# Patient Record
Sex: Male | Born: 1975 | Race: Black or African American | Hispanic: No | Marital: Single | Smoking: Former smoker
Health system: Southern US, Community
[De-identification: ages and names within clinical notes are randomized; demographics above are authoritative.]

## PROBLEM LIST (undated history)

## (undated) DIAGNOSIS — S82891A Other fracture of right lower leg, initial encounter for closed fracture: Secondary | ICD-10-CM

## (undated) DIAGNOSIS — T8149XA Infection following a procedure, other surgical site, initial encounter: Secondary | ICD-10-CM

## (undated) DIAGNOSIS — K219 Gastro-esophageal reflux disease without esophagitis: Secondary | ICD-10-CM

---

## 2004-11-17 HISTORY — PX: ARTHROSCOPIC REPAIR ACL: SUR80

## 2016-08-24 ENCOUNTER — Emergency Department (HOSPITAL_COMMUNITY)
Admission: EM | Admit: 2016-08-24 | Discharge: 2016-08-24 | Disposition: A | Discharge status: Home / Self Care | Payer: Self-pay | Attending: Emergency Medicine | Admitting: Emergency Medicine

## 2016-08-24 ENCOUNTER — Emergency Department (HOSPITAL_COMMUNITY): Payer: Self-pay

## 2016-08-24 ENCOUNTER — Encounter (HOSPITAL_COMMUNITY): Payer: Self-pay | Admitting: Emergency Medicine

## 2016-08-24 DIAGNOSIS — S82451B Displaced comminuted fracture of shaft of right fibula, initial encounter for open fracture type I or II: Secondary | ICD-10-CM | POA: Insufficient documentation

## 2016-08-24 DIAGNOSIS — T148XXA Other injury of unspecified body region, initial encounter: Secondary | ICD-10-CM

## 2016-08-24 DIAGNOSIS — Z23 Encounter for immunization: Secondary | ICD-10-CM | POA: Insufficient documentation

## 2016-08-24 DIAGNOSIS — W3400XA Accidental discharge from unspecified firearms or gun, initial encounter: Secondary | ICD-10-CM | POA: Insufficient documentation

## 2016-08-24 DIAGNOSIS — Y9289 Other specified places as the place of occurrence of the external cause: Secondary | ICD-10-CM | POA: Insufficient documentation

## 2016-08-24 DIAGNOSIS — Y999 Unspecified external cause status: Secondary | ICD-10-CM | POA: Insufficient documentation

## 2016-08-24 DIAGNOSIS — Y9302 Activity, running: Secondary | ICD-10-CM | POA: Insufficient documentation

## 2016-08-24 LAB — PROTIME-INR
INR: 1.01
Prothrombin Time: 13.3 seconds (ref 11.4–15.2)

## 2016-08-24 LAB — COMPREHENSIVE METABOLIC PANEL
ALBUMIN: 4 g/dL (ref 3.5–5.0)
ALK PHOS: 97 U/L (ref 38–126)
ALT: 25 U/L (ref 17–63)
AST: 34 U/L (ref 15–41)
Anion gap: 14 (ref 5–15)
BILIRUBIN TOTAL: 0.4 mg/dL (ref 0.3–1.2)
BUN: 14 mg/dL (ref 6–20)
CALCIUM: 8.7 mg/dL — AB (ref 8.9–10.3)
CO2: 19 mmol/L — ABNORMAL LOW (ref 22–32)
Chloride: 106 mmol/L (ref 101–111)
Creatinine, Ser: 1.69 mg/dL — ABNORMAL HIGH (ref 0.61–1.24)
GFR calc Af Amer: 57 mL/min — ABNORMAL LOW (ref >60)
GFR calc non Af Amer: 49 mL/min — ABNORMAL LOW (ref >60)
GLUCOSE: 117 mg/dL — AB (ref 65–99)
Potassium: 3.1 mmol/L — ABNORMAL LOW (ref 3.5–5.1)
Sodium: 139 mmol/L (ref 135–145)
TOTAL PROTEIN: 6.8 g/dL (ref 6.5–8.1)

## 2016-08-24 LAB — ETHANOL: ALCOHOL ETHYL (B): 55 mg/dL — AB (ref <5)

## 2016-08-24 LAB — I-STAT CHEM 8, ED
BUN: 16 mg/dL (ref 6–20)
CALCIUM ION: 1.09 mmol/L — AB (ref 1.15–1.40)
CHLORIDE: 104 mmol/L (ref 101–111)
Creatinine, Ser: 1.8 mg/dL — ABNORMAL HIGH (ref 0.61–1.24)
Glucose, Bld: 116 mg/dL — ABNORMAL HIGH (ref 65–99)
HEMATOCRIT: 43 % (ref 39.0–52.0)
Hemoglobin: 14.6 g/dL (ref 13.0–17.0)
POTASSIUM: 3.2 mmol/L — AB (ref 3.5–5.1)
SODIUM: 141 mmol/L (ref 135–145)
TCO2: 20 mmol/L (ref 0–100)

## 2016-08-24 LAB — CBC
HCT: 42 % (ref 39.0–52.0)
Hemoglobin: 13.6 g/dL (ref 13.0–17.0)
MCH: 27.5 pg (ref 26.0–34.0)
MCHC: 32.4 g/dL (ref 30.0–36.0)
MCV: 85 fL (ref 78.0–100.0)
Platelets: 203 10*3/uL (ref 150–400)
RBC: 4.94 MIL/uL (ref 4.22–5.81)
RDW: 12.3 % (ref 11.5–15.5)
WBC: 8.6 10*3/uL (ref 4.0–10.5)

## 2016-08-24 LAB — SAMPLE TO BLOOD BANK

## 2016-08-24 LAB — I-STAT CG4 LACTIC ACID, ED: LACTIC ACID, VENOUS: 5.1 mmol/L — AB (ref 0.5–1.9)

## 2016-08-24 LAB — CDS SEROLOGY

## 2016-08-24 MED ORDER — IOPAMIDOL (ISOVUE-370) INJECTION 76%
INTRAVENOUS | Status: AC
  Filled 2016-08-24: qty 100

## 2016-08-24 MED ORDER — FENTANYL CITRATE (PF) 100 MCG/2ML IJ SOLN
50.0000 ug | Freq: Once | INTRAMUSCULAR | Status: AC
  Administered 2016-08-24: 50 ug via INTRAVENOUS

## 2016-08-24 MED ORDER — OXYCODONE-ACETAMINOPHEN 5-325 MG PO TABS: 1.0000 | ORAL_TABLET | Freq: Four times a day (QID) | ORAL | 0 refills | Status: DC | PRN

## 2016-08-24 MED ORDER — CEFAZOLIN IN D5W 1 GM/50ML IV SOLN: 1.0000 g | Freq: Once | INTRAVENOUS | Status: DC

## 2016-08-24 MED ORDER — TETANUS-DIPHTH-ACELL PERTUSSIS 5-2.5-18.5 LF-MCG/0.5 IM SUSP
0.5000 mL | Freq: Once | INTRAMUSCULAR | Status: AC
  Administered 2016-08-24: 0.5 mL via INTRAMUSCULAR

## 2016-08-24 MED ORDER — DEXTROSE 5 % IV SOLN
INTRAVENOUS | Status: AC | PRN
  Administered 2016-08-24: 1000 mg via INTRAVENOUS

## 2016-08-24 MED ORDER — KETOROLAC TROMETHAMINE 30 MG/ML IJ SOLN
30.0000 mg | Freq: Once | INTRAMUSCULAR | Status: AC
  Administered 2016-08-24: 30 mg via INTRAVENOUS
  Filled 2016-08-24: qty 1

## 2016-08-24 MED ORDER — SODIUM CHLORIDE 0.9 % IV BOLUS (SEPSIS)
1000.0000 mL | Freq: Once | INTRAVENOUS | Status: AC
  Administered 2016-08-24: 1000 mL via INTRAVENOUS

## 2016-08-24 MED ORDER — CEPHALEXIN 500 MG PO CAPS: 500.0000 mg | ORAL_CAPSULE | Freq: Four times a day (QID) | ORAL | 0 refills | Status: DC

## 2016-08-24 MED ORDER — SODIUM CHLORIDE 0.9 % IV SOLN: 1000.0000 mL | Freq: Once | INTRAVENOUS | Status: DC

## 2016-08-24 MED ORDER — OXYCODONE-ACETAMINOPHEN 5-325 MG PO TABS
1.0000 | ORAL_TABLET | ORAL | Status: DC | PRN
  Administered 2016-08-24: 1 via ORAL
  Filled 2016-08-24: qty 1

## 2016-08-24 NOTE — Progress Notes (Signed)
Orthopedic Tech Progress Note Patient Details:  Daniel David 1976-07-11 [id number]  Ortho Devices Type of Ortho Device: Crutches Ortho Device/Splint Location: rle Ortho Device/Splint Interventions: Ordered, Application   Trinna Post 08/24/2016, 4:49 AM
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
## 2016-08-24 NOTE — Progress Notes (Addendum)
Orthopedic Tech Progress Note Patient Details:  Daniel David 1976/01/16 [id number]  Ortho Devices Type of Ortho Device: Post (short leg) splint Ortho Device/Splint Location: rle Ortho Device/Splint Interventions: Ordered, Application Applied splint as per drs verbal order  [name], Elba Dendinger J 08/24/2016, 3:22 AM

## 2016-08-28 ENCOUNTER — Encounter (HOSPITAL_BASED_OUTPATIENT_CLINIC_OR_DEPARTMENT_OTHER): Payer: Self-pay | Admitting: *Deleted
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
## 2016-09-02 ENCOUNTER — Ambulatory Visit (HOSPITAL_BASED_OUTPATIENT_CLINIC_OR_DEPARTMENT_OTHER): Payer: Self-pay | Admitting: Anesthesiology

## 2016-09-02 ENCOUNTER — Encounter (HOSPITAL_BASED_OUTPATIENT_CLINIC_OR_DEPARTMENT_OTHER)
Admission: RE | Disposition: A | Discharge status: Home / Self Care | Payer: Self-pay | Source: Ambulatory Visit | Attending: Orthopedic Surgery

## 2016-09-02 ENCOUNTER — Encounter (HOSPITAL_BASED_OUTPATIENT_CLINIC_OR_DEPARTMENT_OTHER): Payer: Self-pay | Admitting: Anesthesiology

## 2016-09-02 ENCOUNTER — Ambulatory Visit (HOSPITAL_BASED_OUTPATIENT_CLINIC_OR_DEPARTMENT_OTHER)
Admission: RE | Admit: 2016-09-02 | Discharge: 2016-09-02 | Disposition: A | Discharge status: Home / Self Care | Payer: Self-pay | Source: Ambulatory Visit | Attending: Orthopedic Surgery | Admitting: Orthopedic Surgery

## 2016-09-02 DIAGNOSIS — K219 Gastro-esophageal reflux disease without esophagitis: Secondary | ICD-10-CM | POA: Insufficient documentation

## 2016-09-02 DIAGNOSIS — W3400XA Accidental discharge from unspecified firearms or gun, initial encounter: Secondary | ICD-10-CM | POA: Insufficient documentation

## 2016-09-02 DIAGNOSIS — Z87891 Personal history of nicotine dependence: Secondary | ICD-10-CM | POA: Insufficient documentation

## 2016-09-02 DIAGNOSIS — S82451B Displaced comminuted fracture of shaft of right fibula, initial encounter for open fracture type I or II: Secondary | ICD-10-CM | POA: Insufficient documentation

## 2016-09-02 HISTORY — PX: ORIF ANKLE FRACTURE: SHX5408

## 2016-09-02 HISTORY — DX: Gastro-esophageal reflux disease without esophagitis: K21.9

## 2016-09-02 HISTORY — PX: I & D EXTREMITY: SHX5045

## 2016-09-02 SURGERY — OPEN REDUCTION INTERNAL FIXATION (ORIF) ANKLE FRACTURE
Anesthesia: General | Site: Ankle | Laterality: Right

## 2016-09-02 MED ORDER — MEPERIDINE HCL 25 MG/ML IJ SOLN: 6.2500 mg | INTRAMUSCULAR | Status: DC | PRN

## 2016-09-02 MED ORDER — OXYCODONE HCL 5 MG/5ML PO SOLN: 5.0000 mg | Freq: Once | ORAL | Status: DC | PRN

## 2016-09-02 MED ORDER — ROPIVACAINE HCL 5 MG/ML IJ SOLN
INTRAMUSCULAR | Status: DC | PRN
  Administered 2016-09-02 (×2): 10 mL via PERINEURAL

## 2016-09-02 MED ORDER — ONDANSETRON HCL 4 MG/2ML IJ SOLN: 4.0000 mg | Freq: Once | INTRAMUSCULAR | Status: DC | PRN

## 2016-09-02 MED ORDER — HYDROMORPHONE HCL 1 MG/ML IJ SOLN: 0.2500 mg | INTRAMUSCULAR | Status: DC | PRN

## 2016-09-02 MED ORDER — OXYCODONE-ACETAMINOPHEN 5-325 MG PO TABS: 1.0000 | ORAL_TABLET | ORAL | 0 refills | Status: DC | PRN

## 2016-09-02 MED ORDER — DEXAMETHASONE SODIUM PHOSPHATE 4 MG/ML IJ SOLN
INTRAMUSCULAR | Status: DC | PRN
  Administered 2016-09-02: 10 mg via INTRAVENOUS

## 2016-09-02 MED ORDER — SODIUM CHLORIDE 0.9 % IJ SOLN
INTRAMUSCULAR | Status: DC | PRN
  Administered 2016-09-02: 1000 mL via INTRAVENOUS

## 2016-09-02 MED ORDER — OXYCODONE HCL 5 MG PO TABS: 5.0000 mg | ORAL_TABLET | Freq: Once | ORAL | Status: DC | PRN

## 2016-09-02 MED ORDER — ONDANSETRON HCL 4 MG/2ML IJ SOLN
INTRAMUSCULAR | Status: AC
  Filled 2016-09-02: qty 2

## 2016-09-02 MED ORDER — LACTATED RINGERS IV SOLN
INTRAVENOUS | Status: DC
  Administered 2016-09-02: 07:00:00 via INTRAVENOUS

## 2016-09-02 MED ORDER — ONDANSETRON HCL 4 MG PO TABS: 4.0000 mg | ORAL_TABLET | Freq: Three times a day (TID) | ORAL | 0 refills | Status: DC | PRN

## 2016-09-02 MED ORDER — SCOPOLAMINE 1 MG/3DAYS TD PT72: 1.0000 | MEDICATED_PATCH | Freq: Once | TRANSDERMAL | Status: DC | PRN

## 2016-09-02 MED ORDER — PROPOFOL 10 MG/ML IV BOLUS
INTRAVENOUS | Status: DC | PRN
  Administered 2016-09-02: 100 mg via INTRAVENOUS
  Administered 2016-09-02: 50 mg via INTRAVENOUS
  Administered 2016-09-02: 200 mg via INTRAVENOUS

## 2016-09-02 MED ORDER — CEFAZOLIN SODIUM-DEXTROSE 2-4 GM/100ML-% IV SOLN
INTRAVENOUS | Status: AC
  Filled 2016-09-02: qty 100

## 2016-09-02 MED ORDER — ACETAMINOPHEN 500 MG PO TABS
1000.0000 mg | ORAL_TABLET | Freq: Once | ORAL | Status: AC
  Administered 2016-09-02: 1000 mg via ORAL

## 2016-09-02 MED ORDER — LACTATED RINGERS IV SOLN
INTRAVENOUS | Status: DC
  Administered 2016-09-02 (×2): via INTRAVENOUS

## 2016-09-02 MED ORDER — CEPHALEXIN 500 MG PO CAPS: 500.0000 mg | ORAL_CAPSULE | Freq: Two times a day (BID) | ORAL | 0 refills | Status: AC

## 2016-09-02 MED ORDER — FENTANYL CITRATE (PF) 100 MCG/2ML IJ SOLN
INTRAMUSCULAR | Status: AC
  Filled 2016-09-02: qty 2

## 2016-09-02 MED ORDER — MIDAZOLAM HCL 2 MG/2ML IJ SOLN
INTRAMUSCULAR | Status: AC
  Filled 2016-09-02: qty 2

## 2016-09-02 MED ORDER — METHOCARBAMOL 500 MG PO TABS: 500.0000 mg | ORAL_TABLET | Freq: Four times a day (QID) | ORAL | 0 refills | Status: DC | PRN

## 2016-09-02 MED ORDER — LIDOCAINE 2% (20 MG/ML) 5 ML SYRINGE
INTRAMUSCULAR | Status: AC
  Filled 2016-09-02: qty 5

## 2016-09-02 MED ORDER — CHLORHEXIDINE GLUCONATE 4 % EX LIQD: 60.0000 mL | Freq: Once | CUTANEOUS | Status: DC

## 2016-09-02 MED ORDER — MIDAZOLAM HCL 2 MG/2ML IJ SOLN
1.0000 mg | INTRAMUSCULAR | Status: DC | PRN
  Administered 2016-09-02 (×2): 2 mg via INTRAVENOUS

## 2016-09-02 MED ORDER — CEFAZOLIN SODIUM-DEXTROSE 2-4 GM/100ML-% IV SOLN
2.0000 g | INTRAVENOUS | Status: AC
  Administered 2016-09-02: 2 g via INTRAVENOUS

## 2016-09-02 MED ORDER — DOCUSATE SODIUM 100 MG PO CAPS: 100.0000 mg | ORAL_CAPSULE | Freq: Two times a day (BID) | ORAL | 0 refills | Status: DC

## 2016-09-02 MED ORDER — GLYCOPYRROLATE 0.2 MG/ML IJ SOLN: 0.2000 mg | Freq: Once | INTRAMUSCULAR | Status: DC | PRN

## 2016-09-02 MED ORDER — LIDOCAINE 2% (20 MG/ML) 5 ML SYRINGE
INTRAMUSCULAR | Status: DC | PRN
  Administered 2016-09-02: 100 mg via INTRAVENOUS

## 2016-09-02 MED ORDER — BUPIVACAINE HCL (PF) 0.5 % IJ SOLN
INTRAMUSCULAR | Status: AC
  Filled 2016-09-02: qty 30

## 2016-09-02 MED ORDER — ONDANSETRON HCL 4 MG/2ML IJ SOLN
INTRAMUSCULAR | Status: DC | PRN
  Administered 2016-09-02: 4 mg via INTRAVENOUS

## 2016-09-02 MED ORDER — ACETAMINOPHEN 500 MG PO TABS
ORAL_TABLET | ORAL | Status: AC
  Filled 2016-09-02: qty 2

## 2016-09-02 MED ORDER — ASPIRIN EC 81 MG PO TBEC: 81.0000 mg | DELAYED_RELEASE_TABLET | Freq: Every day | ORAL | 0 refills | Status: DC

## 2016-09-02 MED ORDER — DEXAMETHASONE SODIUM PHOSPHATE 10 MG/ML IJ SOLN
INTRAMUSCULAR | Status: AC
  Filled 2016-09-02: qty 1

## 2016-09-02 MED ORDER — BUPIVACAINE-EPINEPHRINE (PF) 0.5% -1:200000 IJ SOLN
INTRAMUSCULAR | Status: DC | PRN
  Administered 2016-09-02 (×2): 10 mL via PERINEURAL

## 2016-09-02 MED ORDER — FENTANYL CITRATE (PF) 100 MCG/2ML IJ SOLN
50.0000 ug | INTRAMUSCULAR | Status: AC | PRN
  Administered 2016-09-02: 100 ug via INTRAVENOUS
  Administered 2016-09-02: 50 ug via INTRAVENOUS
  Administered 2016-09-02: 100 ug via INTRAVENOUS

## 2016-09-02 SURGICAL SUPPLY — 89 items
BANDAGE ACE 4X5 VEL STRL LF (GAUZE/BANDAGES/DRESSINGS) ×3 IMPLANT
BANDAGE ACE 6X5 VEL STRL LF (GAUZE/BANDAGES/DRESSINGS) ×3 IMPLANT
BANDAGE ESMARK 6X9 LF (GAUZE/BANDAGES/DRESSINGS) ×1 IMPLANT
BIT DRILL 3.5X122MM AO FIT (BIT) ×3 IMPLANT
BLADE SURG 10 STRL SS (BLADE) ×3 IMPLANT
BLADE SURG 15 STRL LF DISP TIS (BLADE) ×2 IMPLANT
BLADE SURG 15 STRL SS (BLADE) ×4
BNDG COHESIVE 4X5 TAN STRL (GAUZE/BANDAGES/DRESSINGS) ×3 IMPLANT
BNDG COHESIVE 6X5 TAN STRL LF (GAUZE/BANDAGES/DRESSINGS) ×3 IMPLANT
BNDG ESMARK 6X9 LF (GAUZE/BANDAGES/DRESSINGS) ×3
BNDG GAUZE ELAST 4 BULKY (GAUZE/BANDAGES/DRESSINGS) IMPLANT
CHLORAPREP W/TINT 26ML (MISCELLANEOUS) ×3 IMPLANT
CLOSURE STERI-STRIP 1/2X4 (GAUZE/BANDAGES/DRESSINGS) ×1
CLSR STERI-STRIP ANTIMIC 1/2X4 (GAUZE/BANDAGES/DRESSINGS) ×2 IMPLANT
COVER BACK TABLE 60X90IN (DRAPES) ×3 IMPLANT
COVER BACK TABLE 80X110 HD (DRAPES) ×3 IMPLANT
COVER MAYO STAND STRL (DRAPES) IMPLANT
CUFF TOURNIQUET SINGLE 24IN (TOURNIQUET CUFF) IMPLANT
CUFF TOURNIQUET SINGLE 34IN LL (TOURNIQUET CUFF) ×3 IMPLANT
DECANTER SPIKE VIAL GLASS SM (MISCELLANEOUS) IMPLANT
DRAPE EXTREMITY T 121X128X90 (DRAPE) ×3 IMPLANT
DRAPE IMP U-DRAPE 54X76 (DRAPES) ×3 IMPLANT
DRAPE OEC MINIVIEW 54X84 (DRAPES) ×3 IMPLANT
DRAPE SURG 17X23 STRL (DRAPES) IMPLANT
DRAPE U-SHAPE 47X51 STRL (DRAPES) ×3 IMPLANT
DRILL 2.6X122MM WL AO SHAFT (BIT) ×3 IMPLANT
DRSG EMULSION OIL 3X3 NADH (GAUZE/BANDAGES/DRESSINGS) ×3 IMPLANT
DRSG PAD ABDOMINAL 8X10 ST (GAUZE/BANDAGES/DRESSINGS) ×3 IMPLANT
ELECT REM PT RETURN 9FT ADLT (ELECTROSURGICAL) ×3
ELECTRODE REM PT RTRN 9FT ADLT (ELECTROSURGICAL) ×1 IMPLANT
GAUZE SPONGE 4X4 12PLY STRL (GAUZE/BANDAGES/DRESSINGS) ×3 IMPLANT
GAUZE SPONGE 4X4 16PLY XRAY LF (GAUZE/BANDAGES/DRESSINGS) ×3 IMPLANT
GLOVE BIO SURGEON STRL SZ7.5 (GLOVE) ×6 IMPLANT
GLOVE BIOGEL PI IND STRL 7.0 (GLOVE) ×2 IMPLANT
GLOVE BIOGEL PI IND STRL 8 (GLOVE) ×2 IMPLANT
GLOVE BIOGEL PI INDICATOR 7.0 (GLOVE) ×4
GLOVE BIOGEL PI INDICATOR 8 (GLOVE) ×4
GLOVE ECLIPSE 6.5 STRL STRAW (GLOVE) ×3 IMPLANT
GOWN STRL REUS W/ TWL LRG LVL3 (GOWN DISPOSABLE) ×3 IMPLANT
GOWN STRL REUS W/ TWL XL LVL3 (GOWN DISPOSABLE) ×1 IMPLANT
GOWN STRL REUS W/TWL LRG LVL3 (GOWN DISPOSABLE) ×6
GOWN STRL REUS W/TWL XL LVL3 (GOWN DISPOSABLE) ×2
NEEDLE HYPO 22GX1.5 SAFETY (NEEDLE) IMPLANT
NEEDLE HYPO 25X1 1.5 SAFETY (NEEDLE) IMPLANT
NS IRRIG 1000ML POUR BTL (IV SOLUTION) ×3 IMPLANT
PACK BASIN DAY SURGERY FS (CUSTOM PROCEDURE TRAY) ×3 IMPLANT
PAD ARMBOARD 7.5X6 YLW CONV (MISCELLANEOUS) ×6 IMPLANT
PAD CAST 3X4 CTTN HI CHSV (CAST SUPPLIES) IMPLANT
PAD CAST 4YDX4 CTTN HI CHSV (CAST SUPPLIES) ×1 IMPLANT
PADDING CAST ABS 4INX4YD NS (CAST SUPPLIES) ×2
PADDING CAST ABS 6INX4YD NS (CAST SUPPLIES) ×2
PADDING CAST ABS COTTON 4X4 ST (CAST SUPPLIES) ×1 IMPLANT
PADDING CAST ABS COTTON 6X4 NS (CAST SUPPLIES) ×1 IMPLANT
PADDING CAST COTTON 3X4 STRL (CAST SUPPLIES)
PADDING CAST COTTON 4X4 STRL (CAST SUPPLIES) ×2
PADDING CAST COTTON 6X4 STRL (CAST SUPPLIES) ×3 IMPLANT
PENCIL BUTTON HOLSTER BLD 10FT (ELECTRODE) ×3 IMPLANT
PLATE DIST LAT FIB 149 9H (Plate) ×3 IMPLANT
SCREW BONE 14MMX3.5MM (Screw) ×6 IMPLANT
SCREW BONE 18 (Screw) ×3 IMPLANT
SCREW BONE 3.5X16MM (Screw) ×3 IMPLANT
SCREW LOCK 3.5X14 (Screw) ×6 IMPLANT
SCREW LOCKING 3.5X12 (Screw) ×6 IMPLANT
SET IRRIG Y TYPE TUR BLADDER L (SET/KITS/TRAYS/PACK) ×3 IMPLANT
SLEEVE SCD COMPRESS KNEE MED (MISCELLANEOUS) ×3 IMPLANT
SPLINT FAST PLASTER 5X30 (CAST SUPPLIES) ×40
SPLINT PLASTER CAST FAST 5X30 (CAST SUPPLIES) ×20 IMPLANT
SPONGE GAUZE 4X4 12PLY STER LF (GAUZE/BANDAGES/DRESSINGS) ×3 IMPLANT
SPONGE LAP 18X18 X RAY DECT (DISPOSABLE) ×3 IMPLANT
SPONGE LAP 4X18 X RAY DECT (DISPOSABLE) ×3 IMPLANT
SUCTION FRAZIER HANDLE 10FR (MISCELLANEOUS) ×2
SUCTION TUBE FRAZIER 10FR DISP (MISCELLANEOUS) ×1 IMPLANT
SUT ETHILON 3 0 PS 1 (SUTURE) IMPLANT
SUT MNCRL AB 4-0 PS2 18 (SUTURE) ×3 IMPLANT
SUT MON AB 2-0 CT1 36 (SUTURE) ×3 IMPLANT
SUT MON AB 3-0 SH 27 (SUTURE)
SUT MON AB 3-0 SH27 (SUTURE) IMPLANT
SUT VIC AB 0 SH 27 (SUTURE) ×3 IMPLANT
SUT VIC AB 2-0 SH 27 (SUTURE)
SUT VIC AB 2-0 SH 27XBRD (SUTURE) IMPLANT
SYR BULB 3OZ (MISCELLANEOUS) ×3 IMPLANT
SYR CONTROL 10ML LL (SYRINGE) IMPLANT
TOWEL OR 17X24 6PK STRL BLUE (TOWEL DISPOSABLE) ×6 IMPLANT
TOWEL OR NON WOVEN STRL DISP B (DISPOSABLE) ×3 IMPLANT
TRAY DSU PREP LF (CUSTOM PROCEDURE TRAY) ×3 IMPLANT
TUBE CONNECTING 20'X1/4 (TUBING) ×1
TUBE CONNECTING 20X1/4 (TUBING) ×2 IMPLANT
UNDERPAD 30X30 (UNDERPADS AND DIAPERS) ×3 IMPLANT
YANKAUER SUCT BULB TIP NO VENT (SUCTIONS) ×3 IMPLANT
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
## 2016-09-02 NOTE — Progress Notes (Signed)
Assisted Dr. Crews with right, ultrasound guided, popliteal/saphenous block. Side rails up, monitors on throughout procedure. See vital signs in flow sheet. Tolerated Procedure well. 
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
## 2016-09-02 NOTE — Discharge Instructions (Signed)
Keep leg elevated as much as possible to reduce pain and swelling.  Take antibiotics as prescribed.  You may loosen and re-apply ace wrap if it feels too tight.  Diet: As you were doing prior to hospitalization   Shower:  You have a splint on, leave the splint in place and keep the splint dry with a plastic bag.  Dressing:  You have a splint, then just leave the splint in place and we will change your bandages during your first follow-up appointment.    Activity:  Increase activity slowly as tolerated, but follow the weight bearing instructions below.  The rules on driving is that you can not be taking narcotics while you drive, and you must feel in control of the vehicle.      Post Anesthesia Home Care Instructions  Activity: Get plenty of rest for the remainder of the day. A responsible adult should stay with you for 24 hours following the procedure.  For the next 24 hours, DO NOT: -Drive a car -Advertising copywriter -Drink alcoholic beverages -Take any medication unless instructed by your physician -Make any legal decisions or sign important papers.  Meals: Start with liquid foods such as gelatin or soup. Progress to regular foods as tolerated. Avoid greasy, spicy, heavy foods. If nausea and/or vomiting occur, drink only clear liquids until the nausea and/or vomiting subsides. Call your physician if vomiting continues.  Special Instructions/Symptoms: Your throat may feel dry or sore from the anesthesia or the breathing tube placed in your throat during surgery. If this causes discomfort, gargle with warm salt water. The discomfort should disappear within 24 hours.  If you had a scopolamine patch placed behind your ear for the management of post- operative nausea and/or vomiting:  1. The medication in the patch is effective for 72 hours, after which it should be removed.  Wrap patch in a tissue and discard in the trash. Wash hands thoroughly with soap and water. 2. You may remove  the patch earlier than 72 hours if you experience unpleasant side effects which may include dry mouth, dizziness or visual disturbances. 3. Avoid touching the patch. Wash your hands with soap and water after contact with the patch.   Regional Anesthesia Blocks  1. Numbness or the inability to move the "blocked" extremity may last from 3-48 hours after placement. The length of time depends on the medication injected and your individual response to the medication. If the numbness is not going away after 48 hours, call your surgeon.  2. The extremity that is blocked will need to be protected until the numbness is gone and the  Strength has returned. Because you cannot feel it, you will need to take extra care to avoid injury. Because it may be weak, you may have difficulty moving it or using it. You may not know what position it is in without looking at it while the block is in effect.  3. For blocks in the legs and feet, returning to weight bearing and walking needs to be done carefully. You will need to wait until the numbness is entirely gone and the strength has returned. You should be able to move your leg and foot normally before you try and bear weight or walk. You will need someone to be with you when you first try to ensure you do not fall and possibly risk injury.  4. Bruising and tenderness at the needle site are common side effects and will resolve in a few days.  5. Persistent  numbness or new problems with movement should be communicated to the surgeon or the [facility] [place] [phone])/ [facility] [place] [phone]).  Weight Bearing:  No bearing weight on Right leg  To prevent constipation: you may use a stool softener such as -  Colace (over the counter) 100 mg by mouth twice a day  Drink plenty of fluids (prune juice may be helpful) and high fiber foods Miralax (over the counter) for constipation as needed.    Itching:  If you experience itching with your  medications, try taking only a single pain pill, or even half a pain pill at a time.  You may take up to 10 pain pills per day, and you can also use benadryl over the counter for itching or also to help with sleep.   Precautions:  If you experience chest pain or shortness of breath - call 911 immediately for transfer to the hospital emergency department!!  If you develop a fever greater that 101 F, purulent drainage from wound, increased redness or drainage from wound, or calf pain -- Call the office at [phone]                                                Follow- Up Appointment:  Please call for an appointment to be seen in 2 weeks [place] - [phone]

## 2016-09-02 NOTE — Anesthesia Procedure Notes (Addendum)
Anesthesia Regional Block:  Adductor canal block  Pre-Anesthetic Checklist: ,, timeout performed, Correct Patient, Correct Site, Correct Laterality, Correct Procedure, Correct Position, site marked, Risks and benefits discussed,  Surgical consent,  Pre-op evaluation,  At surgeon's request and post-op pain management  Laterality: Right and Lower  Prep: chloraprep       Needles:  Injection technique: Single-shot  Needle Type: Echogenic Needle     Needle Length: 9cm 9 cm Needle Gauge: 21 and 21 G    Additional Needles:  Procedures: ultrasound guided (picture in chart) Adductor canal block Narrative:  Start time: 09/02/2016 8:34 AM End time: 09/02/2016 8:09 AM Injection made incrementally with aspirations every 5 mL.  Performed by: Personally  Anesthesiologist: Dennisse Swader       Right saphenous block image

## 2016-09-02 NOTE — Anesthesia Preprocedure Evaluation (Signed)
Anesthesia Evaluation  Patient identified by MRN, date of birth, ID band Patient awake    Reviewed: Allergy & Precautions, NPO status , Patient's Chart, lab work & pertinent test results  Airway Mallampati: I  TM Distance: >3 FB Neck ROM: Full    Dental  (+) Teeth Intact, Dental Advisory Given   Pulmonary former smoker,    breath sounds clear to auscultation       Cardiovascular  Rhythm:Regular Rate:Normal     Neuro/Psych    GI/Hepatic GERD  Medicated and Controlled,  Endo/Other    Renal/GU      Musculoskeletal   Abdominal   Peds  Hematology   Anesthesia Other Findings   Reproductive/Obstetrics                             Anesthesia Physical Anesthesia Plan  ASA: I  Anesthesia Plan: General   Post-op Pain Management: GA combined w/ Regional for post-op pain   Induction: Intravenous  Airway Management Planned: LMA  Additional Equipment:   Intra-op Plan:   Post-operative Plan: Extubation in OR  Informed Consent: I have reviewed the patients History and Physical, chart, labs and discussed the procedure including the risks, benefits and alternatives for the proposed anesthesia with the patient or authorized representative who has indicated his/her understanding and acceptance.   Dental advisory given  Plan Discussed with: CRNA, Anesthesiologist and Surgeon  Anesthesia Plan Comments:         Anesthesia Quick Evaluation

## 2016-09-02 NOTE — Anesthesia Procedure Notes (Signed)
Procedure Name: LMA Insertion Date/Time: 09/02/2016 9:18 AM Performed by: Gar GibbonKEETON, Anntionette Madkins S Pre-anesthesia Checklist: Patient identified, Emergency Drugs available, Suction available and Patient being monitored Patient Re-evaluated:Patient Re-evaluated prior to inductionOxygen Delivery Method: Circle system utilized Preoxygenation: Pre-oxygenation with 100% oxygen Intubation Type: IV induction Ventilation: Mask ventilation without difficulty LMA: LMA inserted LMA Size: 4.0 Number of attempts: 3 Airway Equipment and Method: Bite block Placement Confirmation: positive ETCO2 Tube secured with: Tape Dental Injury: Bloody posterior oropharynx  Difficulty Due To: Difficulty was unanticipated and Difficult Airway- due to limited oral opening Future Recommendations: Recommend- induction with short-acting agent, and alternative techniques readily available

## 2016-09-04 ENCOUNTER — Encounter (HOSPITAL_BASED_OUTPATIENT_CLINIC_OR_DEPARTMENT_OTHER): Payer: Self-pay | Admitting: Orthopedic Surgery

## 2016-10-01 ENCOUNTER — Encounter (HOSPITAL_BASED_OUTPATIENT_CLINIC_OR_DEPARTMENT_OTHER): Payer: Self-pay | Admitting: *Deleted

## 2016-10-02 ENCOUNTER — Ambulatory Visit (HOSPITAL_BASED_OUTPATIENT_CLINIC_OR_DEPARTMENT_OTHER)
Admission: RE | Admit: 2016-10-02 | Discharge: 2016-10-02 | Disposition: A | Discharge status: Home / Self Care | Payer: Self-pay | Source: Ambulatory Visit | Attending: Orthopedic Surgery | Admitting: Orthopedic Surgery

## 2016-10-02 ENCOUNTER — Ambulatory Visit (HOSPITAL_BASED_OUTPATIENT_CLINIC_OR_DEPARTMENT_OTHER): Payer: Self-pay | Admitting: Anesthesiology

## 2016-10-02 ENCOUNTER — Encounter (HOSPITAL_BASED_OUTPATIENT_CLINIC_OR_DEPARTMENT_OTHER): Payer: Self-pay | Admitting: Anesthesiology

## 2016-10-02 ENCOUNTER — Encounter (HOSPITAL_BASED_OUTPATIENT_CLINIC_OR_DEPARTMENT_OTHER)
Admission: RE | Disposition: A | Discharge status: Home / Self Care | Payer: Self-pay | Source: Ambulatory Visit | Attending: Orthopedic Surgery

## 2016-10-02 DIAGNOSIS — S82891H Other fracture of right lower leg, subsequent encounter for open fracture type I or II with delayed healing: Secondary | ICD-10-CM | POA: Insufficient documentation

## 2016-10-02 DIAGNOSIS — Z87891 Personal history of nicotine dependence: Secondary | ICD-10-CM | POA: Insufficient documentation

## 2016-10-02 DIAGNOSIS — Z7982 Long term (current) use of aspirin: Secondary | ICD-10-CM | POA: Insufficient documentation

## 2016-10-02 DIAGNOSIS — K219 Gastro-esophageal reflux disease without esophagitis: Secondary | ICD-10-CM | POA: Insufficient documentation

## 2016-10-02 DIAGNOSIS — W3400XD Accidental discharge from unspecified firearms or gun, subsequent encounter: Secondary | ICD-10-CM | POA: Insufficient documentation

## 2016-10-02 HISTORY — PX: I & D EXTREMITY: SHX5045

## 2016-10-02 HISTORY — DX: Other fracture of right lower leg, initial encounter for closed fracture: S82.891A

## 2016-10-02 HISTORY — PX: ORIF ANKLE FRACTURE: SHX5408

## 2016-10-02 HISTORY — PX: HARDWARE REMOVAL: SHX979

## 2016-10-02 SURGERY — OPEN REDUCTION INTERNAL FIXATION (ORIF) ANKLE FRACTURE
Anesthesia: General | Laterality: Right

## 2016-10-02 MED ORDER — ASPIRIN EC 81 MG PO TBEC: 81.0000 mg | DELAYED_RELEASE_TABLET | Freq: Every day | ORAL | 0 refills | Status: DC

## 2016-10-02 MED ORDER — MIDAZOLAM HCL 2 MG/2ML IJ SOLN
INTRAMUSCULAR | Status: AC
  Filled 2016-10-02: qty 2

## 2016-10-02 MED ORDER — FENTANYL CITRATE (PF) 100 MCG/2ML IJ SOLN
50.0000 ug | INTRAMUSCULAR | Status: DC | PRN
  Administered 2016-10-02 (×2): 50 ug via INTRAVENOUS

## 2016-10-02 MED ORDER — ONDANSETRON HCL 4 MG PO TABS: 4.0000 mg | ORAL_TABLET | Freq: Three times a day (TID) | ORAL | 0 refills | Status: DC | PRN

## 2016-10-02 MED ORDER — CEFAZOLIN SODIUM-DEXTROSE 2-4 GM/100ML-% IV SOLN
INTRAVENOUS | Status: AC
  Filled 2016-10-02: qty 100

## 2016-10-02 MED ORDER — OXYCODONE HCL 5 MG PO TABS: 5.0000 mg | ORAL_TABLET | Freq: Once | ORAL | Status: DC | PRN

## 2016-10-02 MED ORDER — LACTATED RINGERS IV SOLN: INTRAVENOUS | Status: DC

## 2016-10-02 MED ORDER — SCOPOLAMINE 1 MG/3DAYS TD PT72: 1.0000 | MEDICATED_PATCH | Freq: Once | TRANSDERMAL | Status: DC | PRN

## 2016-10-02 MED ORDER — LIDOCAINE 2% (20 MG/ML) 5 ML SYRINGE
INTRAMUSCULAR | Status: AC
  Filled 2016-10-02: qty 5

## 2016-10-02 MED ORDER — ACETAMINOPHEN 500 MG PO TABS: 1000.0000 mg | ORAL_TABLET | Freq: Once | ORAL | Status: DC

## 2016-10-02 MED ORDER — SULFAMETHOXAZOLE-TRIMETHOPRIM 800-160 MG PO TABS: 1.0000 | ORAL_TABLET | Freq: Two times a day (BID) | ORAL | 0 refills | Status: AC

## 2016-10-02 MED ORDER — MEPERIDINE HCL 25 MG/ML IJ SOLN: 6.2500 mg | INTRAMUSCULAR | Status: DC | PRN

## 2016-10-02 MED ORDER — MIDAZOLAM HCL 2 MG/2ML IJ SOLN
1.0000 mg | INTRAMUSCULAR | Status: DC | PRN
  Administered 2016-10-02: 2 mg via INTRAVENOUS

## 2016-10-02 MED ORDER — FENTANYL CITRATE (PF) 100 MCG/2ML IJ SOLN
INTRAMUSCULAR | Status: AC
  Filled 2016-10-02: qty 2

## 2016-10-02 MED ORDER — SODIUM CHLORIDE 0.9 % IR SOLN
Status: DC | PRN
  Administered 2016-10-02: 3000 mL

## 2016-10-02 MED ORDER — LACTATED RINGERS IV SOLN
INTRAVENOUS | Status: DC
  Administered 2016-10-02: 11:00:00 via INTRAVENOUS

## 2016-10-02 MED ORDER — HYDROMORPHONE HCL 1 MG/ML IJ SOLN: 0.2500 mg | INTRAMUSCULAR | Status: DC | PRN

## 2016-10-02 MED ORDER — CEPHALEXIN 500 MG PO CAPS: 500.0000 mg | ORAL_CAPSULE | Freq: Three times a day (TID) | ORAL | 0 refills | Status: AC

## 2016-10-02 MED ORDER — OXYCODONE HCL 5 MG/5ML PO SOLN: 5.0000 mg | Freq: Once | ORAL | Status: DC | PRN

## 2016-10-02 MED ORDER — POVIDONE-IODINE 7.5 % EX SOLN: Freq: Once | CUTANEOUS | Status: DC

## 2016-10-02 MED ORDER — PROPOFOL 10 MG/ML IV BOLUS
INTRAVENOUS | Status: DC | PRN
  Administered 2016-10-02: 200 mg via INTRAVENOUS

## 2016-10-02 MED ORDER — DEXAMETHASONE SODIUM PHOSPHATE 10 MG/ML IJ SOLN
INTRAMUSCULAR | Status: AC
  Filled 2016-10-02: qty 1

## 2016-10-02 MED ORDER — LIDOCAINE 2% (20 MG/ML) 5 ML SYRINGE
INTRAMUSCULAR | Status: DC | PRN
  Administered 2016-10-02: 50 mg via INTRAVENOUS

## 2016-10-02 MED ORDER — DEXAMETHASONE SODIUM PHOSPHATE 4 MG/ML IJ SOLN
INTRAMUSCULAR | Status: DC | PRN
  Administered 2016-10-02: 10 mg via INTRAVENOUS

## 2016-10-02 MED ORDER — BUPIVACAINE HCL (PF) 0.5 % IJ SOLN
INTRAMUSCULAR | Status: AC
  Filled 2016-10-02: qty 30

## 2016-10-02 MED ORDER — CEFAZOLIN SODIUM-DEXTROSE 2-4 GM/100ML-% IV SOLN
2.0000 g | INTRAVENOUS | Status: AC
  Administered 2016-10-02: 2 g via INTRAVENOUS

## 2016-10-02 MED ORDER — ONDANSETRON HCL 4 MG/2ML IJ SOLN
INTRAMUSCULAR | Status: DC | PRN
Start: 2016-10-02 — End: 2016-10-02
  Administered 2016-10-02: 4 mg via INTRAVENOUS

## 2016-10-02 MED ORDER — ONDANSETRON HCL 4 MG/2ML IJ SOLN
INTRAMUSCULAR | Status: AC
  Filled 2016-10-02: qty 2

## 2016-10-02 MED ORDER — BUPIVACAINE-EPINEPHRINE (PF) 0.5% -1:200000 IJ SOLN
INTRAMUSCULAR | Status: DC | PRN
  Administered 2016-10-02: 30 mL via PERINEURAL

## 2016-10-02 MED ORDER — PROMETHAZINE HCL 25 MG/ML IJ SOLN: 6.2500 mg | INTRAMUSCULAR | Status: DC | PRN

## 2016-10-02 MED ORDER — PROPOFOL 500 MG/50ML IV EMUL
INTRAVENOUS | Status: AC
Start: 2016-10-02 — End: 2016-10-02
  Filled 2016-10-02: qty 50

## 2016-10-02 SURGICAL SUPPLY — 89 items
BANDAGE ACE 3X5.8 VEL STRL LF (GAUZE/BANDAGES/DRESSINGS) ×3 IMPLANT
BANDAGE ACE 4X5 VEL STRL LF (GAUZE/BANDAGES/DRESSINGS) ×3 IMPLANT
BANDAGE ACE 6X5 VEL STRL LF (GAUZE/BANDAGES/DRESSINGS) ×3 IMPLANT
BANDAGE ESMARK 6X9 LF (GAUZE/BANDAGES/DRESSINGS) ×1 IMPLANT
BLADE SURG 10 STRL SS (BLADE) ×3 IMPLANT
BLADE SURG 15 STRL LF DISP TIS (BLADE) ×2 IMPLANT
BLADE SURG 15 STRL SS (BLADE) ×4
BNDG COHESIVE 4X5 TAN STRL (GAUZE/BANDAGES/DRESSINGS) ×3 IMPLANT
BNDG COHESIVE 6X5 TAN STRL LF (GAUZE/BANDAGES/DRESSINGS) ×3 IMPLANT
BNDG ESMARK 4X9 LF (GAUZE/BANDAGES/DRESSINGS) ×3 IMPLANT
BNDG ESMARK 6X9 LF (GAUZE/BANDAGES/DRESSINGS) ×3
BNDG GAUZE ELAST 4 BULKY (GAUZE/BANDAGES/DRESSINGS) IMPLANT
CHLORAPREP W/TINT 26ML (MISCELLANEOUS) IMPLANT
CLOSURE STERI-STRIP 1/2X4 (GAUZE/BANDAGES/DRESSINGS) ×1
CLSR STERI-STRIP ANTIMIC 1/2X4 (GAUZE/BANDAGES/DRESSINGS) ×2 IMPLANT
COVER BACK TABLE 60X90IN (DRAPES) ×3 IMPLANT
COVER BACK TABLE 80X110 HD (DRAPES) ×3 IMPLANT
COVER MAYO STAND STRL (DRAPES) ×3 IMPLANT
CUFF TOURNIQUET SINGLE 24IN (TOURNIQUET CUFF) ×3 IMPLANT
CUFF TOURNIQUET SINGLE 34IN LL (TOURNIQUET CUFF) IMPLANT
DECANTER SPIKE VIAL GLASS SM (MISCELLANEOUS) IMPLANT
DRAPE EXTREMITY T 121X128X90 (DRAPE) ×3 IMPLANT
DRAPE IMP U-DRAPE 54X76 (DRAPES) ×3 IMPLANT
DRAPE OEC MINIVIEW 54X84 (DRAPES) ×3 IMPLANT
DRAPE SURG 17X23 STRL (DRAPES) IMPLANT
DRAPE U-SHAPE 47X51 STRL (DRAPES) ×3 IMPLANT
DRSG EMULSION OIL 3X3 NADH (GAUZE/BANDAGES/DRESSINGS) ×3 IMPLANT
DRSG PAD ABDOMINAL 8X10 ST (GAUZE/BANDAGES/DRESSINGS) ×3 IMPLANT
ELECT REM PT RETURN 9FT ADLT (ELECTROSURGICAL) ×3
ELECTRODE REM PT RTRN 9FT ADLT (ELECTROSURGICAL) ×1 IMPLANT
GAUZE SPONGE 4X4 12PLY STRL (GAUZE/BANDAGES/DRESSINGS) ×3 IMPLANT
GAUZE XEROFORM 1X8 LF (GAUZE/BANDAGES/DRESSINGS) ×3 IMPLANT
GLOVE BIO SURGEON STRL SZ7.5 (GLOVE) ×6 IMPLANT
GLOVE BIOGEL PI IND STRL 8 (GLOVE) ×2 IMPLANT
GLOVE BIOGEL PI INDICATOR 8 (GLOVE) ×4
GOWN STRL REUS W/ TWL LRG LVL3 (GOWN DISPOSABLE) ×2 IMPLANT
GOWN STRL REUS W/ TWL XL LVL3 (GOWN DISPOSABLE) ×1 IMPLANT
GOWN STRL REUS W/TWL LRG LVL3 (GOWN DISPOSABLE) ×4
GOWN STRL REUS W/TWL XL LVL3 (GOWN DISPOSABLE) ×2
NEEDLE HYPO 22GX1.5 SAFETY (NEEDLE) IMPLANT
NEEDLE HYPO 25X1 1.5 SAFETY (NEEDLE) IMPLANT
NS IRRIG 1000ML POUR BTL (IV SOLUTION) ×3 IMPLANT
PACK BASIN DAY SURGERY FS (CUSTOM PROCEDURE TRAY) ×3 IMPLANT
PAD ARMBOARD 7.5X6 YLW CONV (MISCELLANEOUS) IMPLANT
PAD CAST 3X4 CTTN HI CHSV (CAST SUPPLIES) IMPLANT
PAD CAST 4YDX4 CTTN HI CHSV (CAST SUPPLIES) ×1 IMPLANT
PADDING CAST ABS 3INX4YD NS (CAST SUPPLIES) ×2
PADDING CAST ABS 4INX4YD NS (CAST SUPPLIES) ×2
PADDING CAST ABS COTTON 3X4 (CAST SUPPLIES) ×1 IMPLANT
PADDING CAST ABS COTTON 4X4 ST (CAST SUPPLIES) ×1 IMPLANT
PADDING CAST COTTON 3X4 STRL (CAST SUPPLIES)
PADDING CAST COTTON 4X4 STRL (CAST SUPPLIES) ×2
PADDING CAST COTTON 6X4 STRL (CAST SUPPLIES) ×3 IMPLANT
PENCIL BUTTON HOLSTER BLD 10FT (ELECTRODE) ×3 IMPLANT
SCREW LOCKING 3.5X12 (Screw) ×3 IMPLANT
SET CYSTO W/LG BORE CLAMP LF (SET/KITS/TRAYS/PACK) ×3 IMPLANT
SET IRRIG Y TYPE TUR BLADDER L (SET/KITS/TRAYS/PACK) ×3 IMPLANT
SHEET MEDIUM DRAPE 40X70 STRL (DRAPES) IMPLANT
SLEEVE SCD COMPRESS KNEE MED (MISCELLANEOUS) ×3 IMPLANT
SPLINT FAST PLASTER 5X30 (CAST SUPPLIES) ×2
SPLINT PLASTER CAST FAST 5X30 (CAST SUPPLIES) ×1 IMPLANT
SPONGE GAUZE 4X4 12PLY STER LF (GAUZE/BANDAGES/DRESSINGS) ×3 IMPLANT
SPONGE LAP 18X18 X RAY DECT (DISPOSABLE) ×3 IMPLANT
SPONGE LAP 4X18 X RAY DECT (DISPOSABLE) ×3 IMPLANT
STOCKINETTE 4X48 STRL (DRAPES) ×3 IMPLANT
STOCKINETTE 6  STRL (DRAPES) ×2
STOCKINETTE 6 STRL (DRAPES) ×1 IMPLANT
SUCTION FRAZIER HANDLE 10FR (MISCELLANEOUS) ×2
SUCTION TUBE FRAZIER 10FR DISP (MISCELLANEOUS) ×1 IMPLANT
SUT ETHILON 3 0 PS 1 (SUTURE) ×3 IMPLANT
SUT MNCRL AB 4-0 PS2 18 (SUTURE) IMPLANT
SUT MON AB 2-0 CT1 36 (SUTURE) IMPLANT
SUT MON AB 3-0 SH 27 (SUTURE)
SUT MON AB 3-0 SH27 (SUTURE) IMPLANT
SUT MON AB 4-0 PC3 18 (SUTURE) IMPLANT
SUT PROLENE 3 0 PS 2 (SUTURE) IMPLANT
SUT VIC AB 0 SH 27 (SUTURE) ×3 IMPLANT
SUT VIC AB 2-0 SH 27 (SUTURE)
SUT VIC AB 2-0 SH 27XBRD (SUTURE) IMPLANT
SUT VIC AB 3-0 FS2 27 (SUTURE) IMPLANT
SYR BULB 3OZ (MISCELLANEOUS) ×3 IMPLANT
SYR CONTROL 10ML LL (SYRINGE) IMPLANT
TOWEL OR 17X24 6PK STRL BLUE (TOWEL DISPOSABLE) ×6 IMPLANT
TOWEL OR NON WOVEN STRL DISP B (DISPOSABLE) ×3 IMPLANT
TRAY DSU PREP LF (CUSTOM PROCEDURE TRAY) ×3 IMPLANT
TUBE CONNECTING 20'X1/4 (TUBING) ×1
TUBE CONNECTING 20X1/4 (TUBING) ×2 IMPLANT
UNDERPAD 30X30 (UNDERPADS AND DIAPERS) ×3 IMPLANT
YANKAUER SUCT BULB TIP NO VENT (SUCTIONS) ×3 IMPLANT
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
## 2016-10-02 NOTE — Anesthesia Procedure Notes (Addendum)
Anesthesia Regional Block:  Popliteal block  Pre-Anesthetic Checklist: ,, timeout performed, Correct Patient, Correct Site, Correct Laterality, Correct Procedure, Correct Position, site marked, Risks and benefits discussed,  Surgical consent,  Pre-op evaluation,  At surgeon's request and post-op pain management  Laterality: Right  Prep: chloraprep       Needles:  Injection technique: Single-shot  Needle Type: Echogenic Needle     Needle Length: 9cm 9 cm Needle Gauge: 21 and 21 G    Additional Needles:  Procedures: ultrasound guided (picture in chart) Popliteal block Narrative:  Start time: 10/02/2016 12:05 PM End time: 10/02/2016 12:08 PM Injection made incrementally with aspirations every 5 mL.  Performed by: Personally  Anesthesiologist: Shona SimpsonHOLLIS, Daniel David  Additional Notes: No immediate complications. Pt tolerated well.

## 2016-10-02 NOTE — Anesthesia Postprocedure Evaluation (Signed)
Anesthesia Post Note  Patient: Daniel David  Procedure(s) Performed: Procedure(s) (LRB): OPEN REDUCTION INTERNAL FIXATION (ORIF) ANKLE FRACTURE, right (Right) HARDWARE REMOVAL right ankle (Right) IRRIGATION AND DEBRIDEMENT right ankle (Right)  Patient location during evaluation: PACU Anesthesia Type: General and Regional Level of consciousness: awake and alert Pain management: pain level controlled Vital Signs Assessment: post-procedure vital signs reviewed and stable Respiratory status: spontaneous breathing, nonlabored ventilation, respiratory function stable and patient connected to nasal cannula oxygen Cardiovascular status: blood pressure returned to baseline and stable Postop Assessment: no signs of nausea or vomiting Anesthetic complications: no    Last Vitals:  Vitals:   10/02/16 1415 10/02/16 1419  BP: 106/72   Pulse: 68 (!) 57  Resp: 10 13  Temp:      Last Pain:  Vitals:   10/02/16 1358  TempSrc:   PainSc: Asleep                 Shelton SilvasKevin D Nivan Melendrez

## 2016-10-02 NOTE — Anesthesia Preprocedure Evaluation (Addendum)
Anesthesia Evaluation  Patient identified by MRN, date of birth, ID band Patient awake    Reviewed: Allergy & Precautions, NPO status , Patient's Chart, lab work & pertinent test results  Airway Mallampati: I  TM Distance: >3 FB Neck ROM: Full    Dental  (+) Teeth Intact, Dental Advisory Given   Pulmonary former smoker,    breath sounds clear to auscultation       Cardiovascular negative cardio ROS   Rhythm:Regular Rate:Normal     Neuro/Psych negative neurological ROS  negative psych ROS   GI/Hepatic Neg liver ROS, GERD  Medicated,  Endo/Other  negative endocrine ROS  Renal/GU negative Renal ROS  negative genitourinary   Musculoskeletal negative musculoskeletal ROS (+)   Abdominal   Peds negative pediatric ROS (+)  Hematology negative hematology ROS (+)   Anesthesia Other Findings   Reproductive/Obstetrics negative OB ROS                            Lab Results  Component Value Date   WBC 8.6 08/24/2016   HGB 14.6 08/24/2016   HCT 43.0 08/24/2016   MCV 85.0 08/24/2016   PLT 203 08/24/2016   Lab Results  Component Value Date   INR 1.01 08/24/2016     Anesthesia Physical Anesthesia Plan  ASA: II  Anesthesia Plan: General   Post-op Pain Management: GA combined w/ Regional for post-op pain   Induction: Intravenous  Airway Management Planned: LMA  Additional Equipment:   Intra-op Plan:   Post-operative Plan: Extubation in OR  Informed Consent: I have reviewed the patients History and Physical, chart, labs and discussed the procedure including the risks, benefits and alternatives for the proposed anesthesia with the patient or authorized representative who has indicated his/her understanding and acceptance.   Dental advisory given  Plan Discussed with: CRNA  Anesthesia Plan Comments:         Anesthesia Quick Evaluation

## 2016-10-02 NOTE — Anesthesia Procedure Notes (Signed)
Procedure Name: LMA Insertion Performed by: Emmory Solivan W Pre-anesthesia Checklist: Patient identified, Emergency Drugs available, Suction available and Patient being monitored Patient Re-evaluated:Patient Re-evaluated prior to inductionOxygen Delivery Method: Circle system utilized Preoxygenation: Pre-oxygenation with 100% oxygen Intubation Type: IV induction Ventilation: Mask ventilation without difficulty LMA: LMA inserted LMA Size: 4.0 Number of attempts: 1 Placement Confirmation: positive ETCO2 Tube secured with: Tape Dental Injury: Teeth and Oropharynx as per pre-operative assessment        

## 2016-10-02 NOTE — Discharge Instructions (Signed)
Elevate leg as frequently as possible.    You may loosen ace wrap and re-apply if it feels too tight.  Diet: As you were doing prior to hospitalization   Shower:  You have a splint on, leave the splint in place and keep the splint dry with a plastic bag.  Dressing:  You have a splint, then just leave the splint in place and we will change your bandages during your first follow-up appointment.    Activity:  Increase activity slowly as tolerated, but follow the weight bearing instructions below.  The rules on driving is that you can not be taking narcotics while you drive, and you must feel in control of the vehicle.    Weight Bearing:  Non weight bearing right leg.  To prevent constipation: you may use a stool softener such as -  Colace (over the counter) 100 mg by mouth twice a day  Drink plenty of fluids (prune juice may be helpful) and high fiber foods Miralax (over the counter) for constipation as needed.    Itching:  If you experience itching with your medications, try taking only a single pain pill, or even half a pain pill at a time.  You may take up to 10 pain pills per day, and you can also use benadryl over the counter for itching or also to help with sleep.   Precautions:  If you experience chest pain or shortness of breath - call 911 immediately for transfer to the hospital emergency department!!  If you develop a fever greater that 101 F, purulent drainage from wound, increased redness or drainage from wound, or calf pain -- Call the office at [phone]                                                Follow- Up Appointment:  Please call for an appointment to be seen on Monday 10/06/16 [place] - [phone]     Post Anesthesia Home Care Instructions  Activity: Get plenty of rest for the remainder of the day. A responsible adult should stay with you for 24 hours following the procedure.  For the next 24 hours, DO NOT: -Drive a car -Advertising copywriterperate machinery -Drink  alcoholic beverages -Take any medication unless instructed by your physician -Make any legal decisions or sign important papers.  Meals: Start with liquid foods such as gelatin or soup. Progress to regular foods as tolerated. Avoid greasy, spicy, heavy foods. If nausea and/or vomiting occur, drink only clear liquids until the nausea and/or vomiting subsides. Call your physician if vomiting continues.  Special Instructions/Symptoms: Your throat may feel dry or sore from the anesthesia or the breathing tube placed in your throat during surgery. If this causes discomfort, gargle with warm salt water. The discomfort should disappear within 24 hours.  If you had a scopolamine patch placed behind your ear for the management of post- operative nausea and/or vomiting:  1. The medication in the patch is effective for 72 hours, after which it should be removed.  Wrap patch in a tissue and discard in the trash. Wash hands thoroughly with soap and water. 2. You may remove the patch earlier than 72 hours if you experience unpleasant side effects which may include dry mouth, dizziness or visual disturbances. 3. Avoid touching the patch. Wash your hands with soap and water after contact with the patch.  Regional Anesthesia Blocks  1. Numbness or the inability to move the "blocked" extremity may last from 3-48 hours after placement. The length of time depends on the medication injected and your individual response to the medication. If the numbness is not going away after 48 hours, call your surgeon.  2. The extremity that is blocked will need to be protected until the numbness is gone and the  Strength has returned. Because you cannot feel it, you will need to take extra care to avoid injury. Because it may be weak, you may have difficulty moving it or using it. You may not know what position it is in without looking at it while the block is in effect.  3. For blocks in the legs and feet, returning to weight  bearing and walking needs to be done carefully. You will need to wait until the numbness is entirely gone and the strength has returned. You should be able to move your leg and foot normally before you try and bear weight or walk. You will need someone to be with you when you first try to ensure you do not fall and possibly risk injury.  4. Bruising and tenderness at the needle site are common side effects and will resolve in a few days.  5. Persistent numbness or new problems with movement should be communicated to the surgeon or the [facility]  [phone])/ [facility] [place] [phone]).  Call your surgeon if you experience:   1.  Fever over 101.0. 2.  Inability to urinate. 3.  Nausea and/or vomiting. 4.  Extreme swelling or bruising at the surgical site. 5.  Continued bleeding from the incision. 6.  Increased pain, redness or drainage from the incision. 7.  Problems related to your pain medication. 8.  Any problems and/or concerns
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
## 2016-10-02 NOTE — Transfer of Care (Signed)
Immediate Anesthesia Transfer of Care Note  Patient: Daniel David  Procedure(s) Performed: Procedure(s): OPEN REDUCTION INTERNAL FIXATION (ORIF) ANKLE FRACTURE, right (Right) HARDWARE REMOVAL right ankle (Right) IRRIGATION AND DEBRIDEMENT right ankle (Right)  Patient Location: PACU  Anesthesia Type:General  Level of Consciousness: awake and sedated  Airway & Oxygen Therapy: Patient Spontanous Breathing and Patient connected to face mask oxygen  Post-op Assessment: Report given to RN and Post -op Vital signs reviewed and stable  Post vital signs: Reviewed and stable  Last Vitals:  Vitals:   10/02/16 1355 10/02/16 1356  BP: 121/73   Pulse:  61  Resp: (!) 7 20  Temp:      Last Pain:  Vitals:   10/02/16 1044  TempSrc: Oral  PainSc: 10-Worst pain ever      Patients Stated Pain Goal: 3 (10/02/16 1044)  Complications: No apparent anesthesia complications

## 2016-10-02 NOTE — Progress Notes (Signed)
Assisted Dr. Hollis with right, ultrasound guided, popliteal block. Side rails up, monitors on throughout procedure. See vital signs in flow sheet. Tolerated Procedure well. 

## 2016-10-05 ENCOUNTER — Encounter (HOSPITAL_BASED_OUTPATIENT_CLINIC_OR_DEPARTMENT_OTHER): Payer: Self-pay | Admitting: Orthopedic Surgery

## 2016-10-07 LAB — AEROBIC/ANAEROBIC CULTURE (SURGICAL/DEEP WOUND)

## 2016-10-07 LAB — AEROBIC/ANAEROBIC CULTURE W GRAM STAIN (SURGICAL/DEEP WOUND)

## 2016-10-15 ENCOUNTER — Other Ambulatory Visit: Payer: Self-pay | Admitting: *Deleted

## 2016-11-03 ENCOUNTER — Encounter (HOSPITAL_BASED_OUTPATIENT_CLINIC_OR_DEPARTMENT_OTHER): Payer: Self-pay | Admitting: *Deleted

## 2016-11-03 DIAGNOSIS — T8149XA Infection following a procedure, other surgical site, initial encounter: Secondary | ICD-10-CM | POA: Diagnosis present

## 2016-11-03 DIAGNOSIS — S82891A Other fracture of right lower leg, initial encounter for closed fracture: Secondary | ICD-10-CM | POA: Diagnosis present
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
## 2016-11-04 ENCOUNTER — Encounter (HOSPITAL_BASED_OUTPATIENT_CLINIC_OR_DEPARTMENT_OTHER)
Admission: RE | Disposition: A | Discharge status: Home / Self Care | Payer: Self-pay | Source: Ambulatory Visit | Attending: Orthopedic Surgery

## 2016-11-04 ENCOUNTER — Encounter (HOSPITAL_BASED_OUTPATIENT_CLINIC_OR_DEPARTMENT_OTHER): Payer: Self-pay

## 2016-11-04 ENCOUNTER — Ambulatory Visit (HOSPITAL_BASED_OUTPATIENT_CLINIC_OR_DEPARTMENT_OTHER): Payer: Self-pay | Admitting: Certified Registered"

## 2016-11-04 ENCOUNTER — Ambulatory Visit (HOSPITAL_BASED_OUTPATIENT_CLINIC_OR_DEPARTMENT_OTHER)
Admission: RE | Admit: 2016-11-04 | Discharge: 2016-11-04 | Disposition: A | Discharge status: Home / Self Care | Payer: Self-pay | Source: Ambulatory Visit | Attending: Orthopedic Surgery | Admitting: Orthopedic Surgery

## 2016-11-04 DIAGNOSIS — T8149XA Infection following a procedure, other surgical site, initial encounter: Secondary | ICD-10-CM | POA: Diagnosis present

## 2016-11-04 DIAGNOSIS — W3400XD Accidental discharge from unspecified firearms or gun, subsequent encounter: Secondary | ICD-10-CM | POA: Insufficient documentation

## 2016-11-04 DIAGNOSIS — Z87891 Personal history of nicotine dependence: Secondary | ICD-10-CM | POA: Insufficient documentation

## 2016-11-04 DIAGNOSIS — S82891H Other fracture of right lower leg, subsequent encounter for open fracture type I or II with delayed healing: Secondary | ICD-10-CM | POA: Insufficient documentation

## 2016-11-04 DIAGNOSIS — T814XXA Infection following a procedure, initial encounter: Secondary | ICD-10-CM | POA: Insufficient documentation

## 2016-11-04 DIAGNOSIS — Y831 Surgical operation with implant of artificial internal device as the cause of abnormal reaction of the patient, or of later complication, without mention of misadventure at the time of the procedure: Secondary | ICD-10-CM | POA: Insufficient documentation

## 2016-11-04 DIAGNOSIS — Z1810 Retained metal fragments, unspecified: Secondary | ICD-10-CM | POA: Insufficient documentation

## 2016-11-04 DIAGNOSIS — S82891A Other fracture of right lower leg, initial encounter for closed fracture: Secondary | ICD-10-CM | POA: Diagnosis present

## 2016-11-04 DIAGNOSIS — T8484XA Pain due to internal orthopedic prosthetic devices, implants and grafts, initial encounter: Secondary | ICD-10-CM | POA: Insufficient documentation

## 2016-11-04 HISTORY — DX: Infection following a procedure, other surgical site, initial encounter: T81.49XA

## 2016-11-04 HISTORY — PX: INCISION AND DRAINAGE ABSCESS: SHX5864

## 2016-11-04 HISTORY — PX: HARDWARE REMOVAL: SHX979

## 2016-11-04 SURGERY — INCISION AND DRAINAGE, ABSCESS
Anesthesia: General | Site: Ankle | Laterality: Right

## 2016-11-04 MED ORDER — OXYCODONE-ACETAMINOPHEN 5-325 MG PO TABS: 1.0000 | ORAL_TABLET | ORAL | 0 refills | Status: DC | PRN

## 2016-11-04 MED ORDER — ACETAMINOPHEN 500 MG PO TABS
1000.0000 mg | ORAL_TABLET | Freq: Once | ORAL | Status: AC
  Administered 2016-11-04: 1000 mg via ORAL

## 2016-11-04 MED ORDER — MIDAZOLAM HCL 2 MG/2ML IJ SOLN
1.0000 mg | INTRAMUSCULAR | Status: DC | PRN
  Administered 2016-11-04: 2 mg via INTRAVENOUS

## 2016-11-04 MED ORDER — BUPIVACAINE HCL (PF) 0.5 % IJ SOLN
INTRAMUSCULAR | Status: DC | PRN
  Administered 2016-11-04: 15 mL

## 2016-11-04 MED ORDER — BUPIVACAINE HCL (PF) 0.5 % IJ SOLN
INTRAMUSCULAR | Status: AC
  Filled 2016-11-04: qty 30

## 2016-11-04 MED ORDER — FENTANYL CITRATE (PF) 100 MCG/2ML IJ SOLN
INTRAMUSCULAR | Status: AC
  Filled 2016-11-04: qty 2

## 2016-11-04 MED ORDER — FENTANYL CITRATE (PF) 100 MCG/2ML IJ SOLN
50.0000 ug | INTRAMUSCULAR | Status: AC | PRN
  Administered 2016-11-04: 25 ug via INTRAVENOUS
  Administered 2016-11-04: 50 ug via INTRAVENOUS
  Administered 2016-11-04: 25 ug via INTRAVENOUS
  Administered 2016-11-04: 100 ug via INTRAVENOUS

## 2016-11-04 MED ORDER — LACTATED RINGERS IV SOLN
INTRAVENOUS | Status: DC
  Administered 2016-11-04 (×2): via INTRAVENOUS

## 2016-11-04 MED ORDER — LACTATED RINGERS IV SOLN: INTRAVENOUS | Status: DC

## 2016-11-04 MED ORDER — SCOPOLAMINE 1 MG/3DAYS TD PT72: 1.0000 | MEDICATED_PATCH | Freq: Once | TRANSDERMAL | Status: DC | PRN

## 2016-11-04 MED ORDER — FENTANYL CITRATE (PF) 100 MCG/2ML IJ SOLN
25.0000 ug | INTRAMUSCULAR | Status: DC | PRN
  Administered 2016-11-04: 50 ug via INTRAVENOUS

## 2016-11-04 MED ORDER — PROPOFOL 10 MG/ML IV BOLUS
INTRAVENOUS | Status: DC | PRN
  Administered 2016-11-04: 200 mg via INTRAVENOUS

## 2016-11-04 MED ORDER — LIDOCAINE 2% (20 MG/ML) 5 ML SYRINGE
INTRAMUSCULAR | Status: AC
  Filled 2016-11-04: qty 5

## 2016-11-04 MED ORDER — CHLORHEXIDINE GLUCONATE 4 % EX LIQD: 60.0000 mL | Freq: Once | CUTANEOUS | Status: DC

## 2016-11-04 MED ORDER — LIDOCAINE 2% (20 MG/ML) 5 ML SYRINGE
INTRAMUSCULAR | Status: DC | PRN
  Administered 2016-11-04: 100 mg via INTRAVENOUS

## 2016-11-04 MED ORDER — DEXAMETHASONE SODIUM PHOSPHATE 4 MG/ML IJ SOLN
INTRAMUSCULAR | Status: DC | PRN
Start: 2016-11-04 — End: 2016-11-04
  Administered 2016-11-04: 10 mg via INTRAVENOUS

## 2016-11-04 MED ORDER — DEXAMETHASONE SODIUM PHOSPHATE 10 MG/ML IJ SOLN
INTRAMUSCULAR | Status: AC
  Filled 2016-11-04: qty 1

## 2016-11-04 MED ORDER — PROMETHAZINE HCL 25 MG/ML IJ SOLN: 6.2500 mg | INTRAMUSCULAR | Status: DC | PRN

## 2016-11-04 MED ORDER — ONDANSETRON HCL 4 MG/2ML IJ SOLN
INTRAMUSCULAR | Status: DC | PRN
  Administered 2016-11-04: 4 mg via INTRAVENOUS

## 2016-11-04 MED ORDER — 0.9 % SODIUM CHLORIDE (POUR BTL) OPTIME
TOPICAL | Status: DC | PRN
  Administered 2016-11-04: 1000 mL

## 2016-11-04 MED ORDER — PHENYLEPHRINE 40 MCG/ML (10ML) SYRINGE FOR IV PUSH (FOR BLOOD PRESSURE SUPPORT)
PREFILLED_SYRINGE | INTRAVENOUS | Status: AC
  Filled 2016-11-04: qty 10

## 2016-11-04 MED ORDER — CEFAZOLIN SODIUM-DEXTROSE 2-4 GM/100ML-% IV SOLN
2.0000 g | INTRAVENOUS | Status: AC
  Administered 2016-11-04: 2 g via INTRAVENOUS

## 2016-11-04 MED ORDER — ACETAMINOPHEN 500 MG PO TABS
ORAL_TABLET | ORAL | Status: AC
  Filled 2016-11-04: qty 2

## 2016-11-04 MED ORDER — MIDAZOLAM HCL 2 MG/2ML IJ SOLN
INTRAMUSCULAR | Status: AC
  Filled 2016-11-04: qty 2

## 2016-11-04 MED ORDER — ONDANSETRON HCL 4 MG PO TABS: 4.0000 mg | ORAL_TABLET | Freq: Three times a day (TID) | ORAL | 0 refills | Status: DC | PRN

## 2016-11-04 MED ORDER — ONDANSETRON HCL 4 MG/2ML IJ SOLN
INTRAMUSCULAR | Status: AC
  Filled 2016-11-04: qty 2

## 2016-11-04 SURGICAL SUPPLY — 60 items
BANDAGE ACE 4X5 VEL STRL LF (GAUZE/BANDAGES/DRESSINGS) ×3 IMPLANT
BANDAGE ACE 6X5 VEL STRL LF (GAUZE/BANDAGES/DRESSINGS) IMPLANT
BANDAGE ESMARK 6X9 LF (GAUZE/BANDAGES/DRESSINGS) ×1 IMPLANT
BLADE SURG 10 STRL SS (BLADE) IMPLANT
BLADE SURG 15 STRL LF DISP TIS (BLADE) ×1 IMPLANT
BLADE SURG 15 STRL SS (BLADE) ×2
BNDG COHESIVE 6X5 TAN STRL LF (GAUZE/BANDAGES/DRESSINGS) ×3 IMPLANT
BNDG ESMARK 6X9 LF (GAUZE/BANDAGES/DRESSINGS) ×3
BNDG GAUZE ELAST 4 BULKY (GAUZE/BANDAGES/DRESSINGS) IMPLANT
CHLORAPREP W/TINT 26ML (MISCELLANEOUS) ×3 IMPLANT
COVER BACK TABLE 60X90IN (DRAPES) ×3 IMPLANT
COVER BACK TABLE 80X110 HD (DRAPES) IMPLANT
COVER MAYO STAND STRL (DRAPES) IMPLANT
CUFF TOURNIQUET SINGLE 34IN LL (TOURNIQUET CUFF) ×3 IMPLANT
DRAPE EXTREMITY T 121X128X90 (DRAPE) ×3 IMPLANT
DRAPE IMP U-DRAPE 54X76 (DRAPES) IMPLANT
DRAPE OEC MINIVIEW 54X84 (DRAPES) ×3 IMPLANT
DRAPE SURG 17X23 STRL (DRAPES) IMPLANT
DRAPE U-SHAPE 47X51 STRL (DRAPES) ×3 IMPLANT
DRSG ADAPTIC 3X8 NADH LF (GAUZE/BANDAGES/DRESSINGS) ×3 IMPLANT
DRSG EMULSION OIL 3X3 NADH (GAUZE/BANDAGES/DRESSINGS) IMPLANT
DRSG PAD ABDOMINAL 8X10 ST (GAUZE/BANDAGES/DRESSINGS) ×3 IMPLANT
ELECT REM PT RETURN 9FT ADLT (ELECTROSURGICAL) ×3
ELECTRODE REM PT RTRN 9FT ADLT (ELECTROSURGICAL) ×1 IMPLANT
GAUZE SPONGE 4X4 12PLY STRL (GAUZE/BANDAGES/DRESSINGS) ×3 IMPLANT
GLOVE BIO SURGEON STRL SZ 6.5 (GLOVE) ×2 IMPLANT
GLOVE BIO SURGEON STRL SZ7.5 (GLOVE) ×6 IMPLANT
GLOVE BIO SURGEONS STRL SZ 6.5 (GLOVE) ×1
GLOVE BIOGEL PI IND STRL 6.5 (GLOVE) ×1 IMPLANT
GLOVE BIOGEL PI IND STRL 8 (GLOVE) ×2 IMPLANT
GLOVE BIOGEL PI INDICATOR 6.5 (GLOVE) ×2
GLOVE BIOGEL PI INDICATOR 8 (GLOVE) ×4
GOWN STRL REUS W/ TWL LRG LVL3 (GOWN DISPOSABLE) ×3 IMPLANT
GOWN STRL REUS W/ TWL XL LVL3 (GOWN DISPOSABLE) ×1 IMPLANT
GOWN STRL REUS W/TWL LRG LVL3 (GOWN DISPOSABLE) ×6
GOWN STRL REUS W/TWL XL LVL3 (GOWN DISPOSABLE) ×2
NEEDLE HYPO 25X1 1.5 SAFETY (NEEDLE) ×3 IMPLANT
NS IRRIG 1000ML POUR BTL (IV SOLUTION) ×3 IMPLANT
PACK BASIN DAY SURGERY FS (CUSTOM PROCEDURE TRAY) ×3 IMPLANT
PAD ARMBOARD 7.5X6 YLW CONV (MISCELLANEOUS) IMPLANT
PAD CAST 3X4 CTTN HI CHSV (CAST SUPPLIES) IMPLANT
PAD CAST 4YDX4 CTTN HI CHSV (CAST SUPPLIES) ×1 IMPLANT
PADDING CAST COTTON 3X4 STRL (CAST SUPPLIES)
PADDING CAST COTTON 4X4 STRL (CAST SUPPLIES) ×2
PENCIL BUTTON HOLSTER BLD 10FT (ELECTRODE) ×3 IMPLANT
SET IRRIG Y TYPE TUR BLADDER L (SET/KITS/TRAYS/PACK) IMPLANT
SPONGE GAUZE 4X4 12PLY STER LF (GAUZE/BANDAGES/DRESSINGS) IMPLANT
SPONGE LAP 18X18 X RAY DECT (DISPOSABLE) ×3 IMPLANT
SUCTION FRAZIER HANDLE 10FR (MISCELLANEOUS)
SUCTION TUBE FRAZIER 10FR DISP (MISCELLANEOUS) IMPLANT
SUT ETHILON 3 0 PS 1 (SUTURE) ×12 IMPLANT
SYR BULB 3OZ (MISCELLANEOUS) ×3 IMPLANT
SYR CONTROL 10ML LL (SYRINGE) ×3 IMPLANT
TOWEL OR 17X24 6PK STRL BLUE (TOWEL DISPOSABLE) ×3 IMPLANT
TOWEL OR NON WOVEN STRL DISP B (DISPOSABLE) IMPLANT
TRAY DSU PREP LF (CUSTOM PROCEDURE TRAY) IMPLANT
TUBE CONNECTING 20'X1/4 (TUBING) ×1
TUBE CONNECTING 20X1/4 (TUBING) ×2 IMPLANT
UNDERPAD 30X30 (UNDERPADS AND DIAPERS) ×3 IMPLANT
YANKAUER SUCT BULB TIP NO VENT (SUCTIONS) ×3 IMPLANT
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
## 2016-11-04 NOTE — Anesthesia Procedure Notes (Signed)
Procedure Name: LMA Insertion Date/Time: 11/04/2016 2:54 PM Performed by: Gar GibbonKEETON, Lakeria Starkman S Pre-anesthesia Checklist: Patient identified, Emergency Drugs available, Suction available and Patient being monitored Patient Re-evaluated:Patient Re-evaluated prior to inductionOxygen Delivery Method: Circle system utilized Preoxygenation: Pre-oxygenation with 100% oxygen Intubation Type: IV induction Ventilation: Mask ventilation without difficulty LMA: LMA inserted LMA Size: 5.0 Number of attempts: 1 Airway Equipment and Method: Bite block Placement Confirmation: positive ETCO2 Tube secured with: Tape Dental Injury: Teeth and Oropharynx as per pre-operative assessment
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
## 2016-11-04 NOTE — Transfer of Care (Signed)
Immediate Anesthesia Transfer of Care Note  Patient: Daniel David  Procedure(s) Performed: Procedure(s): INCISION AND DRAINAGE RIGHT ANKLE (Right)  Patient Location: PACU  Anesthesia Type:General  Level of Consciousness: awake, sedated, patient cooperative and confused  Airway & Oxygen Therapy: Patient Spontanous Breathing and Patient connected to face mask oxygen  Post-op Assessment: Report given to RN and Post -op Vital signs reviewed and stable  Post vital signs: Reviewed and stable  Last Vitals:  Vitals:   11/04/16 1217  BP: 110/77  Pulse: 63  Resp: 18  Temp: 36.8 C    Last Pain:  Vitals:   11/04/16 1217  TempSrc: Oral  PainSc: 5       Patients Stated Pain Goal: 1 (11/04/16 1217)  Complications: No apparent anesthesia complications

## 2016-11-04 NOTE — Anesthesia Postprocedure Evaluation (Signed)
Anesthesia Post Note  Patient: Daniel David  Procedure(s) Performed: Procedure(s) (LRB): INCISION AND DRAINAGE RIGHT ANKLE (Right) HARDWARE REMOVAL (Right)  Patient location during evaluation: PACU Anesthesia Type: General Level of consciousness: awake and alert Pain management: pain level controlled Vital Signs Assessment: post-procedure vital signs reviewed and stable Respiratory status: spontaneous breathing, nonlabored ventilation, respiratory function stable and patient connected to nasal cannula oxygen Cardiovascular status: blood pressure returned to baseline and stable Postop Assessment: no signs of nausea or vomiting Anesthetic complications: no       Last Vitals:  Vitals:   11/04/16 1615 11/04/16 1630  BP: 131/75 136/88  Pulse: (!) 57 (!) 52  Resp: 16 13  Temp:      Last Pain:  Vitals:   11/04/16 1630  TempSrc:   PainSc: 8                  Cecile HearingStephen Edward Draden Cottingham

## 2016-11-04 NOTE — Anesthesia Preprocedure Evaluation (Signed)
Anesthesia Evaluation  Patient identified by MRN, date of birth, ID band Patient awake    Reviewed: Allergy & Precautions, NPO status , Patient's Chart, lab work & pertinent test results  Airway Mallampati: II  TM Distance: >3 FB Neck ROM: Full    Dental  (+) Teeth Intact, Dental Advisory Given   Pulmonary former smoker,    Pulmonary exam normal breath sounds clear to auscultation       Cardiovascular Exercise Tolerance: Good negative cardio ROS Normal cardiovascular exam Rhythm:Regular Rate:Normal     Neuro/Psych negative neurological ROS  negative psych ROS   GI/Hepatic Neg liver ROS, GERD  Controlled,  Endo/Other  negative endocrine ROS  Renal/GU negative Renal ROS     Musculoskeletal negative musculoskeletal ROS (+)   Abdominal   Peds  Hematology negative hematology ROS (+)   Anesthesia Other Findings Day of surgery medications reviewed with the patient.  Reproductive/Obstetrics                             Anesthesia Physical Anesthesia Plan  ASA: I  Anesthesia Plan: General   Post-op Pain Management:    Induction: Intravenous  Airway Management Planned: LMA  Additional Equipment:   Intra-op Plan:   Post-operative Plan: Extubation in OR  Informed Consent: I have reviewed the patients History and Physical, chart, labs and discussed the procedure including the risks, benefits and alternatives for the proposed anesthesia with the patient or authorized representative who has indicated his/her understanding and acceptance.   Dental advisory given  Plan Discussed with: CRNA  Anesthesia Plan Comments: (Risks/benefits of general anesthesia discussed with patient including risk of damage to teeth, lips, gum, and tongue, nausea/vomiting, allergic reactions to medications, and the possibility of heart attack, stroke and death.  All patient questions answered.  Patient wishes to  proceed.)        Anesthesia Quick Evaluation

## 2016-11-04 NOTE — Discharge Instructions (Signed)
Wear boot when ambulating. Keep dressings on and dry until follow up. Keep leg elevated with ice as much as possible. Continue to take antibiotics as previously prescribed.  Diet: As you were doing prior to hospitalization   Activity:  Increase activity slowly as tolerated, but follow the weight bearing instructions below.  The rules on driving is that you can not be taking narcotics while you drive, and you must feel in control of the vehicle.    Weight Bearing:   As tolerated in your boot.  To prevent constipation: you may use a stool softener such as -  Colace (over the counter) 100 mg by mouth twice a day  Drink plenty of fluids (prune juice may be helpful) and high fiber foods Miralax (over the counter) for constipation as needed.    Itching:  If you experience itching with your medications, try taking only a single pain pill, or even half a pain pill at a time.  You may take up to 10 pain pills per day, and you can also use benadryl over the counter for itching or also to help with sleep.   Precautions:  If you experience chest pain or shortness of breath - call 911 immediately for transfer to the hospital emergency department!!  If you develop a fever greater that 101 F, purulent drainage from wound, increased redness or drainage from wound, or calf pain -- Call the office at [phone]                                                Follow- Up Appointment:  [facility] [phone]    Post Anesthesia Home Care Instructions  Activity: Get plenty of rest for the remainder of the day. A responsible adult should stay with you for 24 hours following the procedure.  For the next 24 hours, DO NOT: -Drive a car -Advertising copywriterperate machinery -Drink alcoholic beverages -Take any medication unless instructed by your physician -Make any legal decisions or sign important papers.  Meals: Start with liquid foods such as gelatin or soup. Progress to  regular foods as tolerated. Avoid greasy, spicy, heavy foods. If nausea and/or vomiting occur, drink only clear liquids until the nausea and/or vomiting subsides. Call your physician if vomiting continues.  Special Instructions/Symptoms: Your throat may feel dry or sore from the anesthesia or the breathing tube placed in your throat during surgery. If this causes discomfort, gargle with warm salt water. The discomfort should disappear within 24 hours.  If you had a scopolamine patch placed behind your ear for the management of post- operative nausea and/or vomiting:  1. The medication in the patch is effective for 72 hours, after which it should be removed.  Wrap patch in a tissue and discard in the trash. Wash hands thoroughly with soap and water. 2. You may remove the patch earlier than 72 hours if you experience unpleasant side effects which may include dry mouth, dizziness or visual disturbances. 3. Avoid touching the patch. Wash your hands with soap and water after contact with the patch.    Call your surgeon if you experience:   1.  Fever over 101.0. 2.  Inability to urinate. 3.  Nausea and/or vomiting. 4.  Extreme swelling or bruising at the surgical site. 5.  Continued bleeding from the incision. 6.  Increased pain, redness or drainage from the incision. 7.  Problems related to your pain medication. 8.  Any problems and/or concerns

## 2016-11-05 ENCOUNTER — Encounter (HOSPITAL_BASED_OUTPATIENT_CLINIC_OR_DEPARTMENT_OTHER): Payer: Self-pay | Admitting: Orthopedic Surgery

## 2016-11-09 LAB — AEROBIC/ANAEROBIC CULTURE (SURGICAL/DEEP WOUND): CULTURE: NO GROWTH

## 2016-11-09 LAB — AEROBIC/ANAEROBIC CULTURE W GRAM STAIN (SURGICAL/DEEP WOUND)

## 2016-11-18 ENCOUNTER — Ambulatory Visit (INDEPENDENT_AMBULATORY_CARE_PROVIDER_SITE_OTHER): Payer: Self-pay | Admitting: Internal Medicine

## 2016-11-18 VITALS — BP 126/92 | HR 75 | Temp 98.2°F | Ht 66.0 in | Wt 163.0 lb

## 2016-11-18 DIAGNOSIS — IMO0001 Reserved for inherently not codable concepts without codable children: Secondary | ICD-10-CM

## 2016-11-18 DIAGNOSIS — T814XXA Infection following a procedure, initial encounter: Secondary | ICD-10-CM

## 2016-11-18 DIAGNOSIS — K219 Gastro-esophageal reflux disease without esophagitis: Secondary | ICD-10-CM | POA: Insufficient documentation

## 2016-11-18 MED ORDER — CIPROFLOXACIN HCL 500 MG PO TABS: 500.0000 mg | ORAL_TABLET | Freq: Two times a day (BID) | ORAL | 0 refills | Status: AC
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
## 2016-11-19 LAB — COMPREHENSIVE METABOLIC PANEL
ALT: 19 U/L (ref 9–46)
AST: 30 U/L (ref 10–40)
Albumin: 4.4 g/dL (ref 3.6–5.1)
Alkaline Phosphatase: 145 U/L — ABNORMAL HIGH (ref 40–115)
BUN: 11 mg/dL (ref 7–25)
CHLORIDE: 104 mmol/L (ref 98–110)
CO2: 25 mmol/L (ref 20–31)
CREATININE: 1.3 mg/dL (ref 0.60–1.35)
Calcium: 9.4 mg/dL (ref 8.6–10.3)
GLUCOSE: 100 mg/dL — AB (ref 65–99)
Potassium: 4.3 mmol/L (ref 3.5–5.3)
SODIUM: 139 mmol/L (ref 135–146)
Total Bilirubin: 0.4 mg/dL (ref 0.2–1.2)
Total Protein: 7.5 g/dL (ref 6.1–8.1)

## 2016-11-19 LAB — C-REACTIVE PROTEIN: CRP: 3.9 mg/L (ref <8.0)

## 2016-11-19 LAB — SEDIMENTATION RATE: Sed Rate: 5 mm/hr (ref 0–15)

## 2016-12-02 ENCOUNTER — Telehealth: Payer: Self-pay | Admitting: *Deleted

## 2016-12-02 NOTE — Telephone Encounter (Signed)
Patient called for results of his labs. His stop date for the antibiotics is today and he wanted to be sure that is is ok to stop.

## 2016-12-02 NOTE — Telephone Encounter (Signed)
Patient notified

## 2016-12-02 NOTE — Telephone Encounter (Signed)
Yes, his labs looked great when he was in clinic and he can stop today and let us know if he has any problems later. thanks
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
